# Patient Record
Sex: Male | Born: 1940 | Race: White | Hispanic: No | Marital: Married | Smoking: Never smoker
Health system: Southern US, Community
[De-identification: ages and names within clinical notes are randomized; demographics above are authoritative.]

## PROBLEM LIST (undated history)

## (undated) DIAGNOSIS — I10 Essential (primary) hypertension: Secondary | ICD-10-CM

## (undated) DIAGNOSIS — M48061 Spinal stenosis, lumbar region without neurogenic claudication: Secondary | ICD-10-CM

## (undated) DIAGNOSIS — M5030 Other cervical disc degeneration, unspecified cervical region: Secondary | ICD-10-CM

## (undated) DIAGNOSIS — K219 Gastro-esophageal reflux disease without esophagitis: Secondary | ICD-10-CM

## (undated) DIAGNOSIS — N189 Chronic kidney disease, unspecified: Secondary | ICD-10-CM

## (undated) DIAGNOSIS — M5136 Other intervertebral disc degeneration, lumbar region: Secondary | ICD-10-CM

## (undated) DIAGNOSIS — M1990 Unspecified osteoarthritis, unspecified site: Secondary | ICD-10-CM

## (undated) DIAGNOSIS — I209 Angina pectoris, unspecified: Secondary | ICD-10-CM

## (undated) DIAGNOSIS — E785 Hyperlipidemia, unspecified: Secondary | ICD-10-CM

## (undated) DIAGNOSIS — R001 Bradycardia, unspecified: Secondary | ICD-10-CM

## (undated) DIAGNOSIS — M51369 Other intervertebral disc degeneration, lumbar region without mention of lumbar back pain or lower extremity pain: Secondary | ICD-10-CM

## (undated) HISTORY — PX: TOE SURGERY: SHX1073

## (undated) HISTORY — PX: OTHER SURGICAL HISTORY: SHX169

## (undated) HISTORY — PX: BACK SURGERY: SHX140

## (undated) HISTORY — PX: APPENDECTOMY: SHX54

## (undated) HISTORY — PX: COLONOSCOPY: SHX174

---

## 2004-12-28 ENCOUNTER — Emergency Department: Payer: Self-pay | Admitting: Emergency Medicine

## 2004-12-29 ENCOUNTER — Ambulatory Visit: Payer: Self-pay | Admitting: Emergency Medicine

## 2007-11-25 ENCOUNTER — Ambulatory Visit: Payer: Self-pay | Admitting: Urology

## 2008-06-01 ENCOUNTER — Ambulatory Visit: Payer: Self-pay | Admitting: Family Medicine

## 2012-12-10 ENCOUNTER — Ambulatory Visit: Payer: Self-pay | Admitting: Family Medicine

## 2013-08-24 ENCOUNTER — Ambulatory Visit: Payer: Self-pay | Admitting: Family Medicine

## 2013-09-02 ENCOUNTER — Ambulatory Visit: Payer: Self-pay | Admitting: Specialist

## 2013-10-01 ENCOUNTER — Ambulatory Visit: Payer: Self-pay | Admitting: Cardiothoracic Surgery

## 2013-10-03 ENCOUNTER — Ambulatory Visit: Payer: Self-pay | Admitting: Cardiothoracic Surgery

## 2014-01-01 ENCOUNTER — Ambulatory Visit: Payer: Self-pay | Admitting: Specialist

## 2017-03-12 ENCOUNTER — Other Ambulatory Visit: Payer: Self-pay | Admitting: Neurology

## 2017-03-12 DIAGNOSIS — M48061 Spinal stenosis, lumbar region without neurogenic claudication: Secondary | ICD-10-CM

## 2017-03-20 ENCOUNTER — Ambulatory Visit
Admission: RE | Admit: 2017-03-20 | Discharge: 2017-03-20 | Disposition: A | Discharge status: Home / Self Care | Payer: Medicare HMO | Source: Ambulatory Visit | Attending: Neurology | Admitting: Neurology

## 2017-03-20 DIAGNOSIS — M48061 Spinal stenosis, lumbar region without neurogenic claudication: Secondary | ICD-10-CM | POA: Insufficient documentation

## 2017-03-21 ENCOUNTER — Ambulatory Visit: Payer: Medicare HMO

## 2017-04-09 ENCOUNTER — Encounter: Payer: Self-pay | Admitting: *Deleted

## 2017-04-10 ENCOUNTER — Encounter: Payer: Self-pay | Admitting: *Deleted

## 2017-04-10 ENCOUNTER — Ambulatory Visit: Payer: Medicare HMO | Admitting: Anesthesiology

## 2017-04-10 ENCOUNTER — Ambulatory Visit
Admission: RE | Admit: 2017-04-10 | Discharge: 2017-04-10 | Disposition: A | Discharge status: Home / Self Care | Payer: Medicare HMO | Source: Ambulatory Visit | Attending: Unknown Physician Specialty | Admitting: Unknown Physician Specialty

## 2017-04-10 ENCOUNTER — Encounter
Admission: RE | Disposition: A | Discharge status: Home / Self Care | Payer: Self-pay | Source: Ambulatory Visit | Attending: Unknown Physician Specialty

## 2017-04-10 DIAGNOSIS — R1310 Dysphagia, unspecified: Secondary | ICD-10-CM | POA: Diagnosis present

## 2017-04-10 DIAGNOSIS — M1990 Unspecified osteoarthritis, unspecified site: Secondary | ICD-10-CM | POA: Diagnosis not present

## 2017-04-10 DIAGNOSIS — I129 Hypertensive chronic kidney disease with stage 1 through stage 4 chronic kidney disease, or unspecified chronic kidney disease: Secondary | ICD-10-CM | POA: Insufficient documentation

## 2017-04-10 DIAGNOSIS — M5030 Other cervical disc degeneration, unspecified cervical region: Secondary | ICD-10-CM | POA: Diagnosis not present

## 2017-04-10 DIAGNOSIS — N189 Chronic kidney disease, unspecified: Secondary | ICD-10-CM | POA: Diagnosis not present

## 2017-04-10 DIAGNOSIS — M5136 Other intervertebral disc degeneration, lumbar region: Secondary | ICD-10-CM | POA: Diagnosis not present

## 2017-04-10 DIAGNOSIS — K222 Esophageal obstruction: Secondary | ICD-10-CM | POA: Diagnosis not present

## 2017-04-10 DIAGNOSIS — M48061 Spinal stenosis, lumbar region without neurogenic claudication: Secondary | ICD-10-CM | POA: Diagnosis not present

## 2017-04-10 DIAGNOSIS — Z7982 Long term (current) use of aspirin: Secondary | ICD-10-CM | POA: Insufficient documentation

## 2017-04-10 DIAGNOSIS — K219 Gastro-esophageal reflux disease without esophagitis: Secondary | ICD-10-CM | POA: Diagnosis not present

## 2017-04-10 DIAGNOSIS — Z79899 Other long term (current) drug therapy: Secondary | ICD-10-CM | POA: Insufficient documentation

## 2017-04-10 DIAGNOSIS — E785 Hyperlipidemia, unspecified: Secondary | ICD-10-CM | POA: Diagnosis not present

## 2017-04-10 HISTORY — DX: Gastro-esophageal reflux disease without esophagitis: K21.9

## 2017-04-10 HISTORY — PX: ESOPHAGOGASTRODUODENOSCOPY (EGD) WITH PROPOFOL: SHX5813

## 2017-04-10 HISTORY — DX: Angina pectoris, unspecified: I20.9

## 2017-04-10 HISTORY — DX: Other cervical disc degeneration, unspecified cervical region: M50.30

## 2017-04-10 HISTORY — DX: Hyperlipidemia, unspecified: E78.5

## 2017-04-10 HISTORY — DX: Other intervertebral disc degeneration, lumbar region: M51.36

## 2017-04-10 HISTORY — DX: Unspecified osteoarthritis, unspecified site: M19.90

## 2017-04-10 HISTORY — DX: Chronic kidney disease, unspecified: N18.9

## 2017-04-10 HISTORY — DX: Other intervertebral disc degeneration, lumbar region without mention of lumbar back pain or lower extremity pain: M51.369

## 2017-04-10 HISTORY — DX: Spinal stenosis, lumbar region without neurogenic claudication: M48.061

## 2017-04-10 HISTORY — DX: Bradycardia, unspecified: R00.1

## 2017-04-10 HISTORY — DX: Essential (primary) hypertension: I10

## 2017-04-10 SURGERY — ESOPHAGOGASTRODUODENOSCOPY (EGD) WITH PROPOFOL
Anesthesia: General

## 2017-04-10 MED ORDER — GLYCOPYRROLATE 0.2 MG/ML IJ SOLN
INTRAMUSCULAR | Status: AC
  Filled 2017-04-10: qty 1

## 2017-04-10 MED ORDER — SODIUM CHLORIDE 0.9 % IV SOLN
INTRAVENOUS | Status: DC
  Administered 2017-04-10: 13:00:00 via INTRAVENOUS

## 2017-04-10 MED ORDER — PROPOFOL 500 MG/50ML IV EMUL
INTRAVENOUS | Status: AC
  Filled 2017-04-10: qty 50

## 2017-04-10 MED ORDER — SODIUM CHLORIDE 0.9 % IV SOLN: INTRAVENOUS | Status: DC

## 2017-04-10 MED ORDER — PROPOFOL 500 MG/50ML IV EMUL
INTRAVENOUS | Status: DC | PRN
  Administered 2017-04-10: 120 ug/kg/min via INTRAVENOUS

## 2017-04-10 MED ORDER — LIDOCAINE HCL 2 % EX GEL
CUTANEOUS | Status: AC
  Filled 2017-04-10: qty 5

## 2017-04-10 MED ORDER — LIDOCAINE HCL (CARDIAC) 20 MG/ML IV SOLN
INTRAVENOUS | Status: DC | PRN
  Administered 2017-04-10: 30 mg via INTRAVENOUS

## 2017-04-10 MED ORDER — GLYCOPYRROLATE 0.2 MG/ML IJ SOLN
INTRAMUSCULAR | Status: DC | PRN
  Administered 2017-04-10: 0.1 mg via INTRAVENOUS

## 2017-04-10 MED ORDER — FENTANYL CITRATE (PF) 100 MCG/2ML IJ SOLN
INTRAMUSCULAR | Status: DC | PRN
  Administered 2017-04-10: 50 ug via INTRAVENOUS

## 2017-04-10 MED ORDER — FENTANYL CITRATE (PF) 100 MCG/2ML IJ SOLN
INTRAMUSCULAR | Status: AC
Start: 2017-04-10 — End: 2017-04-10
  Filled 2017-04-10: qty 2

## 2017-04-10 NOTE — Anesthesia Preprocedure Evaluation (Signed)
Anesthesia Evaluation  Patient identified by MRN, date of birth, ID band Patient awake    Reviewed: Allergy & Precautions, NPO status , Patient's Chart, lab work & pertinent test results  Airway Mallampati: II       Dental  (+) Upper Dentures, Lower Dentures   Pulmonary neg pulmonary ROS,    breath sounds clear to auscultation       Cardiovascular Exercise Tolerance: Good hypertension, Pt. on medications + angina  Rhythm:Regular Rate:Normal     Neuro/Psych negative neurological ROS  negative psych ROS   GI/Hepatic Neg liver ROS, GERD  ,  Endo/Other  negative endocrine ROS  Renal/GU CRFRenal disease     Musculoskeletal   Abdominal Normal abdominal exam  (+)   Peds  Hematology negative hematology ROS (+)   Anesthesia Other Findings   Reproductive/Obstetrics                             Anesthesia Physical Anesthesia Plan  ASA: II  Anesthesia Plan: General   Post-op Pain Management:    Induction: Intravenous  Airway Management Planned: Natural Airway and Nasal Cannula  Additional Equipment:   Intra-op Plan:   Post-operative Plan:   Informed Consent: I have reviewed the patients History and Physical, chart, labs and discussed the procedure including the risks, benefits and alternatives for the proposed anesthesia with the patient or authorized representative who has indicated his/her understanding and acceptance.     Plan Discussed with: CRNA  Anesthesia Plan Comments:         Anesthesia Quick Evaluation

## 2017-04-10 NOTE — Anesthesia Procedure Notes (Signed)
Performed by: COOK-MARTIN, Analena Gama Pre-anesthesia Checklist: Patient identified, Emergency Drugs available, Suction available, Patient being monitored and Timeout performed Patient Re-evaluated:Patient Re-evaluated prior to inductionOxygen Delivery Method: Nasal cannula Preoxygenation: Pre-oxygenation with 100% oxygen Intubation Type: IV induction Airway Equipment and Method: Bite block Placement Confirmation: CO2 detector and positive ETCO2     

## 2017-04-10 NOTE — Op Note (Signed)
[facility] Gastroenterology Patient Name: Cody HackJimmie Mullendore Procedure Date: 04/10/2017 2:00 PM MRN: [id number] Account #: [id number] Date of Birth: 12/11/1940 Admit Type: Outpatient Age: 89 [facility] Gender: Male Note Status: Finalized Procedure:            Upper GI endoscopy Indications:          Dysphagia Providers:            Scot Junobert T. Eneida Evers, MD Referring MD:         Nat ChristenMario E. Zada Finderslmedo, MD (Referring MD) Medicines:            Propofol per Anesthesia Complications:        No immediate complications. Procedure:            Pre-Anesthesia Assessment:                       - After reviewing the risks and benefits, the patient                        was deemed in satisfactory condition to undergo the                        procedure.                       After obtaining informed consent, the endoscope was                        passed under direct vision. Throughout the procedure,                        the patient's blood pressure, pulse, and oxygen                        saturations were monitored continuously. The Endoscope                        was introduced through the mouth, and advanced to the                        second part of duodenum. The upper GI endoscopy was                        accomplished without difficulty. The patient tolerated                        the procedure well. Findings:      A mild Schatzki ring (acquired) was found at the gastroesophageal       junction. A guidewire was placed and the scope was withdrawn. Dilation       was performed with a Savary dilator with mild resistance at 17 mm.      Patchy mildly erythematous mucosa without bleeding was found in the       gastric antrum. Stomach otherwise normal.      The examined duodenum was normal. Impression:           - Mild Schatzki ring. Dilated.                       - Erythematous mucosa in the antrum.                       -  Normal examined duodenum.      - No specimens collected. Recommendation:       - soft food for 3 days, eat slowly, chew well, take                        small bites. Stool or blood test for H. pylori stomach                        infection. Scot Jun, MD 04/10/2017 2:13:00 PM This report has been signed electronically. Number of Addenda: 0 Note Initiated On: 04/10/2017 2:00 PM      [facility]

## 2017-04-10 NOTE — H&P (Signed)
   Primary Care Physician:  Olmedo, Mario Ernesto, MD Primary GastroenterologiDione Housekeeperst:  Dr. Mechele CollinElliott  Pre-Procedure History & Physical: HPI:  Cody HangJimmie J Giovannini is a 76 y.o. male is here for an endoscopy.   Past Medical History:  Diagnosis Date  . Anginal pain (HCC)   . Arthritis   . Bradycardia   . CKD (chronic kidney disease)   . DDD (degenerative disc disease), cervical   . DDD (degenerative disc disease), lumbar   . Elevated lipids   . GERD (gastroesophageal reflux disease)   . Hypertension   . Spinal stenosis, lumbar     Past Surgical History:  Procedure Laterality Date  . APPENDECTOMY    . BACK SURGERY    . broncoscopy    . COLONOSCOPY    . TOE SURGERY      Prior to Admission medications   Medication Sig Start Date End Date Taking? Authorizing Provider  aspirin 81 MG chewable tablet Chew 81 mg by mouth daily.   Yes [provider]  diclofenac (VOLTAREN) 75 MG EC tablet Take 75 mg by mouth 2 (two) times daily.   Yes [provider]  diltiazem (CARDIZEM) 120 MG tablet Take 120 mg by mouth 4 (four) times daily.   Yes [provider]  lovastatin (MEVACOR) 40 MG tablet Take 40 mg by mouth at bedtime.   Yes [provider]  omeprazole (PRILOSEC) 20 MG capsule Take 20 mg by mouth daily.   Yes [provider]  predniSONE (DELTASONE) 10 MG tablet Take 10 mg by mouth daily with breakfast.   Yes [provider]    Allergies as of 01/07/2017  . (Not on File)    History reviewed. No pertinent family history.  Social History   Social History  . Marital status: Married    Spouse name: N/A  . Number of children: N/A  . Years of education: N/A   Occupational History  . Not on file.   Social History Main Topics  . Smoking status: Never Smoker  . Smokeless tobacco: Current User    Types: Chew  . Alcohol use No  . Drug use: No  . Sexual activity: Not on file   Other Topics Concern  . Not on file   Social History  Narrative  . No narrative on file    Review of Systems: See HPI, otherwise negative ROS  Physical Exam: BP 129/72   Pulse (!) 54   Temp (!) 96 F (35.6 C) (Tympanic)   Resp 18   Ht 6' (1.829 m)   Wt 78.5 kg (173 lb)   SpO2 100%   BMI 23.46 kg/m  General:   Alert,  pleasant and cooperative in NAD Head:  Normocephalic and atraumatic. Neck:  Supple; no masses or thyromegaly. Lungs:  Clear throughout to auscultation.    Heart:  Regular rate and rhythm. Abdomen:  Soft, nontender and nondistended. Normal bowel sounds, without guarding, and without rebound.   Neurologic:  Alert and  oriented x4;  grossly normal neurologically.  Impression/Plan: Cody Wong is here for an endoscopy to be performed for dysphagia.  Risks, benefits, limitations, and alternatives regarding  endoscopy have been reviewed with the patient.  Questions have been answered.  All parties agreeable.   Lynnae PrudeELLIOTT, ROBERT, MD  04/10/2017, 1:54 PM

## 2017-04-10 NOTE — Anesthesia Postprocedure Evaluation (Signed)
Anesthesia Post Note  Patient: Cody Wong  Procedure(s) Performed: Procedure(s) (LRB): ESOPHAGOGASTRODUODENOSCOPY (EGD) WITH PROPOFOL (N/A)  Patient location during evaluation: PACU Anesthesia Type: General Level of consciousness: awake Pain management: pain level controlled Vital Signs Assessment: post-procedure vital signs reviewed and stable Respiratory status: nonlabored ventilation Cardiovascular status: stable Anesthetic complications: no     Last Vitals:  Vitals:   04/10/17 1253 04/10/17 1415  BP: 129/72 99/63  Pulse: (!) 54 (!) 50  Resp: 18 16  Temp: (!) 35.6 C 36.1 C    Last Pain:  Vitals:   04/10/17 1415  TempSrc: Tympanic                 VAN STAVEREN,Kalifa Cadden

## 2017-04-10 NOTE — Transfer of Care (Signed)
Immediate Anesthesia Transfer of Care Note  Patient: Cody Wong  Procedure(s) Performed: Procedure(s): ESOPHAGOGASTRODUODENOSCOPY (EGD) WITH PROPOFOL (N/A)  Patient Location: PACU  Anesthesia Type:General  Level of Consciousness: awake and sedated  Airway & Oxygen Therapy: Patient Spontanous Breathing and Patient connected to nasal cannula oxygen  Post-op Assessment: Report given to RN and Post -op Vital signs reviewed and stable  Post vital signs: Reviewed and stable  Last Vitals:  Vitals:   04/10/17 1253  BP: 129/72  Pulse: (!) 54  Resp: 18  Temp: (!) 35.6 C    Last Pain:  Vitals:   04/10/17 1253  TempSrc: Tympanic         Complications: No apparent anesthesia complications

## 2017-04-10 NOTE — Anesthesia Post-op Follow-up Note (Cosign Needed)
Anesthesia QCDR form completed.        

## 2017-04-12 ENCOUNTER — Encounter: Payer: Self-pay | Admitting: Unknown Physician Specialty

## 2017-07-02 ENCOUNTER — Other Ambulatory Visit: Payer: Self-pay | Admitting: Orthopedic Surgery

## 2017-07-02 DIAGNOSIS — M1611 Unilateral primary osteoarthritis, right hip: Secondary | ICD-10-CM

## 2017-07-02 DIAGNOSIS — M25551 Pain in right hip: Principal | ICD-10-CM

## 2017-07-02 DIAGNOSIS — G8929 Other chronic pain: Secondary | ICD-10-CM

## 2017-07-09 ENCOUNTER — Ambulatory Visit
Admission: RE | Admit: 2017-07-09 | Discharge: 2017-07-09 | Disposition: A | Discharge status: Home / Self Care | Payer: Medicare HMO | Source: Ambulatory Visit | Attending: Orthopedic Surgery | Admitting: Orthopedic Surgery

## 2017-07-09 DIAGNOSIS — M1611 Unilateral primary osteoarthritis, right hip: Secondary | ICD-10-CM | POA: Diagnosis not present

## 2017-07-09 DIAGNOSIS — K4090 Unilateral inguinal hernia, without obstruction or gangrene, not specified as recurrent: Secondary | ICD-10-CM | POA: Insufficient documentation

## 2017-07-09 DIAGNOSIS — M25551 Pain in right hip: Secondary | ICD-10-CM | POA: Diagnosis present

## 2017-07-09 DIAGNOSIS — G8929 Other chronic pain: Secondary | ICD-10-CM | POA: Diagnosis not present

## 2017-09-12 ENCOUNTER — Other Ambulatory Visit: Payer: Self-pay | Admitting: Family Medicine

## 2017-09-12 ENCOUNTER — Ambulatory Visit
Admission: RE | Admit: 2017-09-12 | Discharge: 2017-09-12 | Disposition: A | Discharge status: Home / Self Care | Payer: Medicare HMO | Source: Ambulatory Visit | Attending: Family Medicine | Admitting: Family Medicine

## 2017-09-12 DIAGNOSIS — R1032 Left lower quadrant pain: Secondary | ICD-10-CM

## 2017-09-12 DIAGNOSIS — R1909 Other intra-abdominal and pelvic swelling, mass and lump: Secondary | ICD-10-CM | POA: Insufficient documentation

## 2017-10-09 ENCOUNTER — Other Ambulatory Visit: Payer: Self-pay | Admitting: Family Medicine

## 2017-10-09 DIAGNOSIS — R1909 Other intra-abdominal and pelvic swelling, mass and lump: Secondary | ICD-10-CM

## 2017-10-16 ENCOUNTER — Other Ambulatory Visit: Payer: Self-pay | Admitting: Family Medicine

## 2017-10-16 DIAGNOSIS — R1909 Other intra-abdominal and pelvic swelling, mass and lump: Secondary | ICD-10-CM

## 2018-08-28 ENCOUNTER — Other Ambulatory Visit: Payer: Self-pay | Admitting: Family Medicine

## 2018-08-28 DIAGNOSIS — R1031 Right lower quadrant pain: Secondary | ICD-10-CM

## 2018-09-02 ENCOUNTER — Other Ambulatory Visit: Payer: Self-pay | Admitting: Family Medicine

## 2018-09-02 DIAGNOSIS — R1012 Left upper quadrant pain: Secondary | ICD-10-CM

## 2018-09-02 DIAGNOSIS — R1011 Right upper quadrant pain: Secondary | ICD-10-CM

## 2018-09-03 ENCOUNTER — Ambulatory Visit
Admission: RE | Admit: 2018-09-03 | Discharge: 2018-09-03 | Disposition: A | Discharge status: Home / Self Care | Payer: Medicare HMO | Source: Ambulatory Visit | Attending: Family Medicine | Admitting: Family Medicine

## 2018-09-03 DIAGNOSIS — R1012 Left upper quadrant pain: Secondary | ICD-10-CM | POA: Insufficient documentation

## 2018-09-03 DIAGNOSIS — N2889 Other specified disorders of kidney and ureter: Secondary | ICD-10-CM | POA: Diagnosis not present

## 2018-09-03 DIAGNOSIS — R1011 Right upper quadrant pain: Secondary | ICD-10-CM | POA: Insufficient documentation

## 2018-09-11 ENCOUNTER — Other Ambulatory Visit: Payer: Self-pay | Admitting: Family Medicine

## 2018-09-11 DIAGNOSIS — N2889 Other specified disorders of kidney and ureter: Secondary | ICD-10-CM

## 2018-09-26 ENCOUNTER — Ambulatory Visit
Admission: RE | Admit: 2018-09-26 | Discharge: 2018-09-26 | Disposition: A | Discharge status: Home / Self Care | Payer: Medicare HMO | Source: Ambulatory Visit | Attending: Family Medicine | Admitting: Family Medicine

## 2018-09-26 DIAGNOSIS — N2889 Other specified disorders of kidney and ureter: Secondary | ICD-10-CM | POA: Insufficient documentation

## 2018-09-26 MED ORDER — GADOBUTROL 1 MMOL/ML IV SOLN
10.0000 mL | Freq: Once | INTRAVENOUS | Status: AC | PRN
  Administered 2018-09-26: 8 mL via INTRAVENOUS

## 2019-06-25 IMAGING — US US ABDOMEN COMPLETE
1 series · 13 of 25 positions shown · non-contrast
Comparison: Ultrasound abdomen 06/01/2008

CLINICAL DATA: Right upper quadrant and left upper quadrant pain

EXAM:
ABDOMEN ULTRASOUND COMPLETE

[Series 1: us abdomen complete · 0.27mm/px · 13 of 108 slices shown]
[im 1/108]
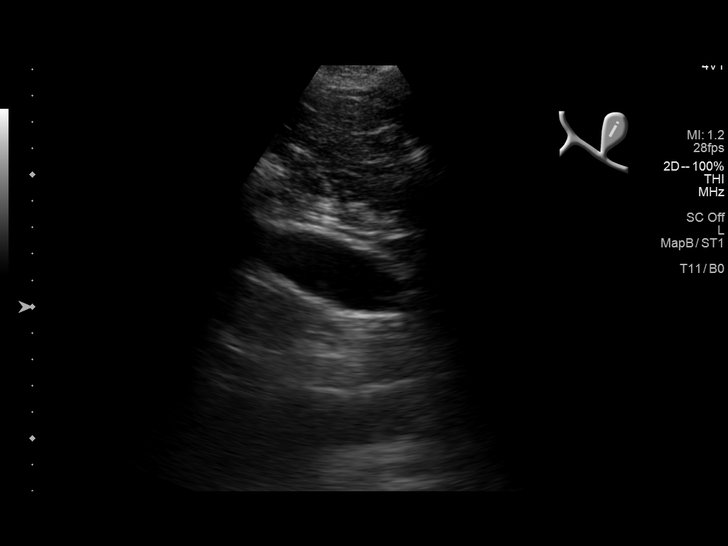
[im 9/108]
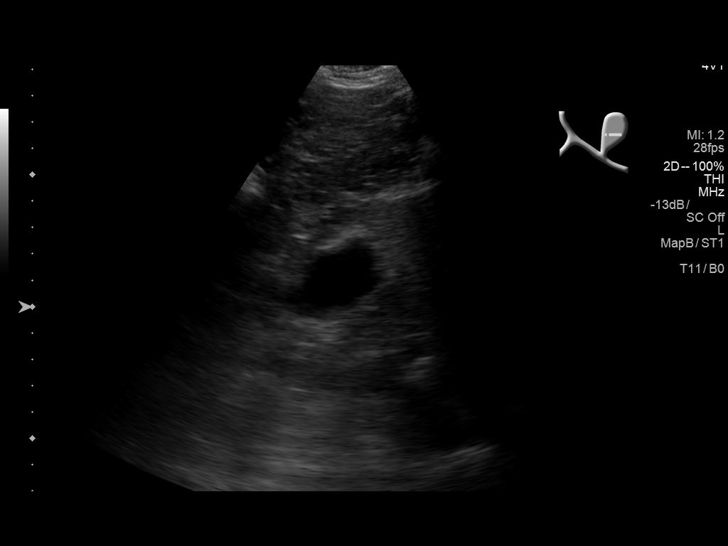
[im 18/108]
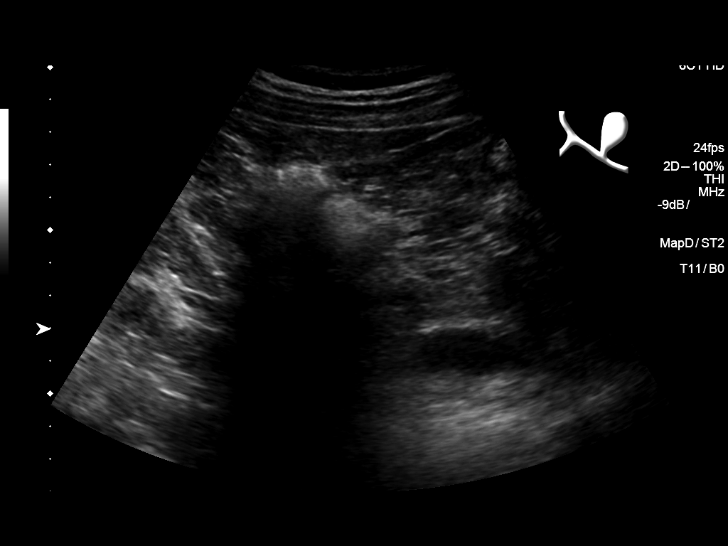
[im 27/108]
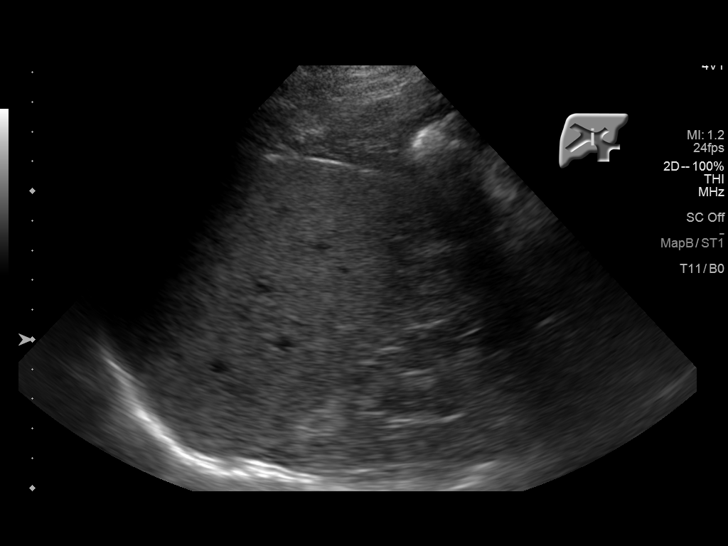
[im 36/108]
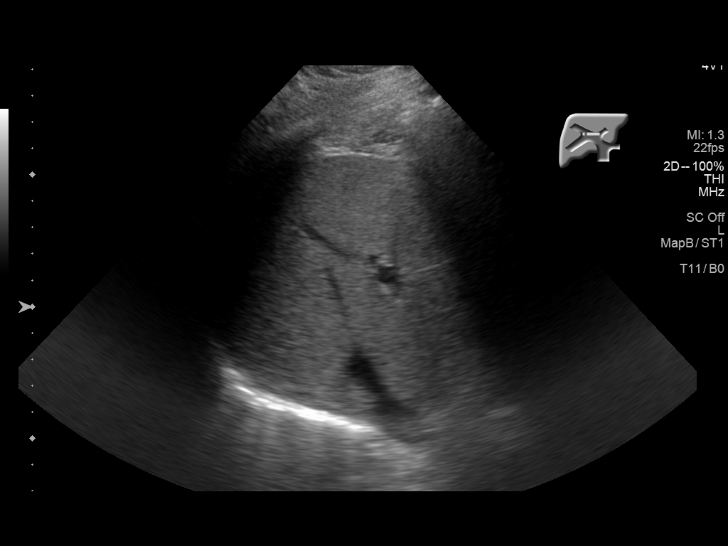
[im 45/108]
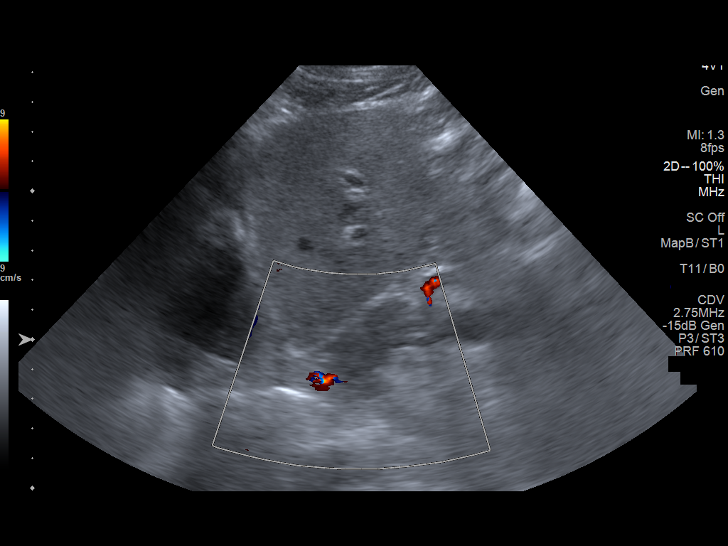
[im 54/108]
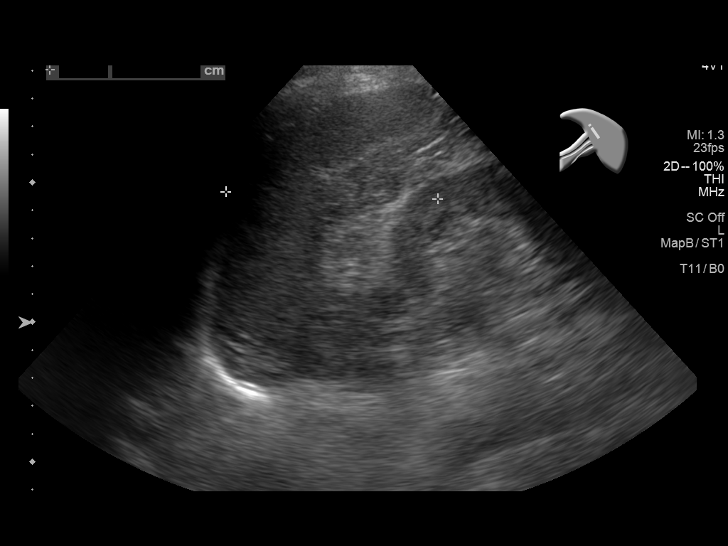
[im 63/108]
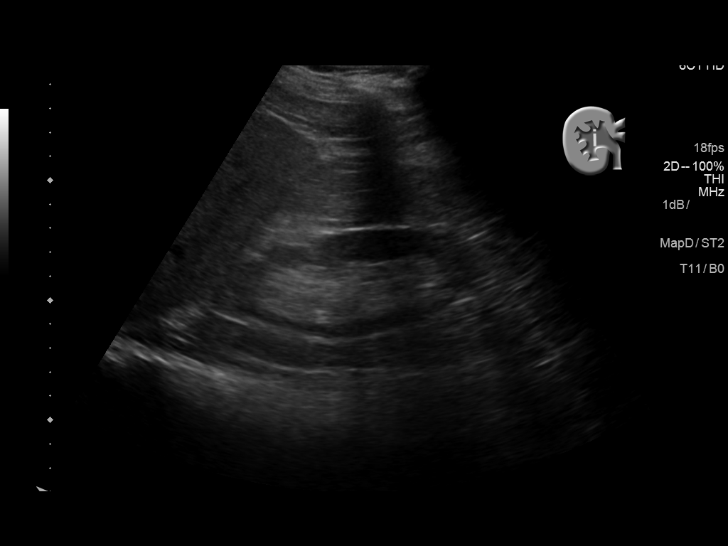
[im 72/108]
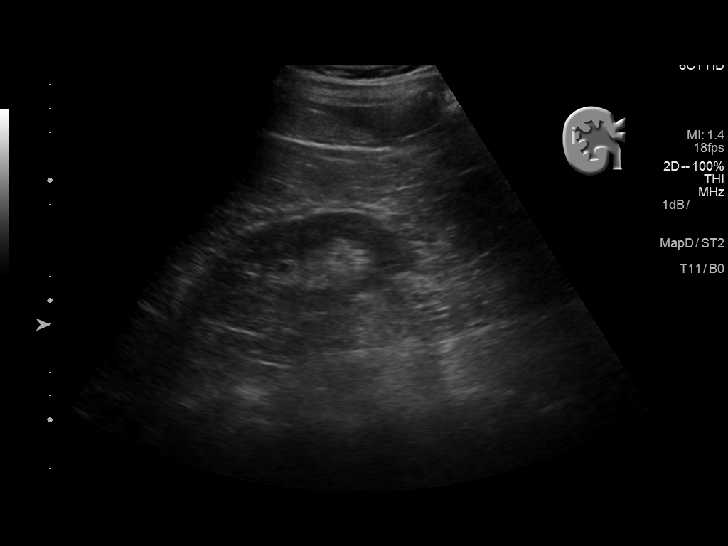
[im 81/108]
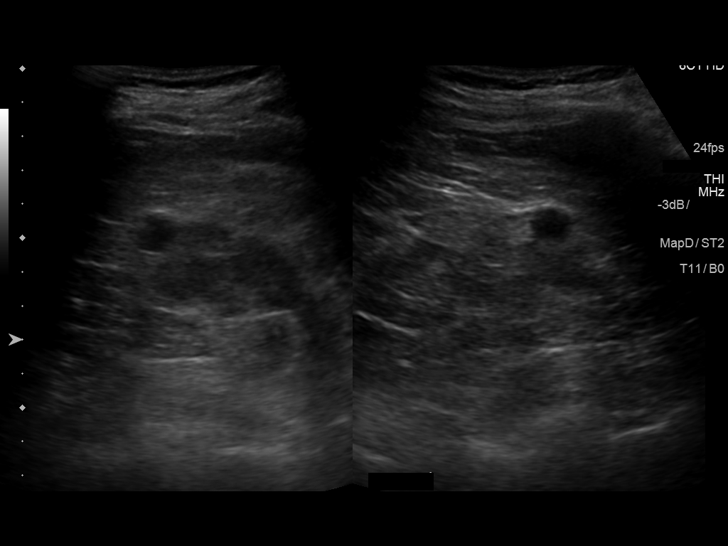
[im 90/108]
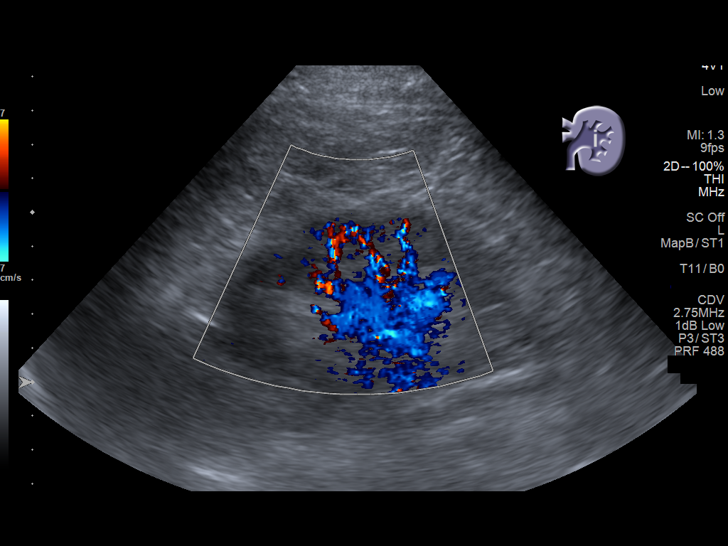
[im 99/108]
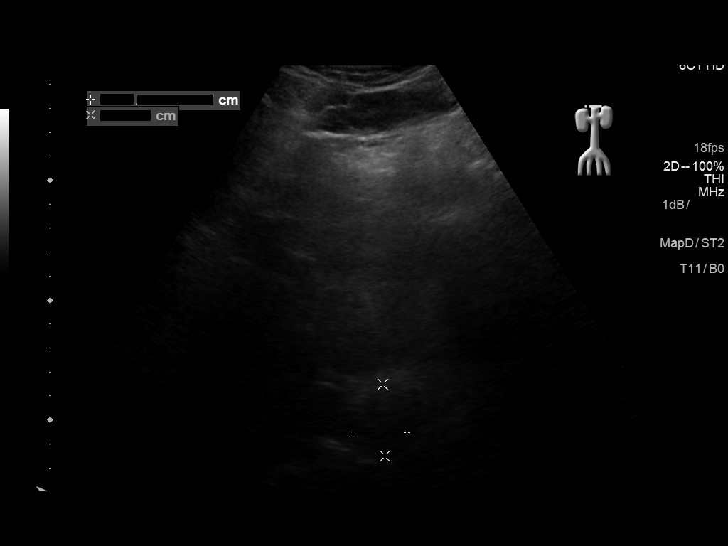
[im 108/108]
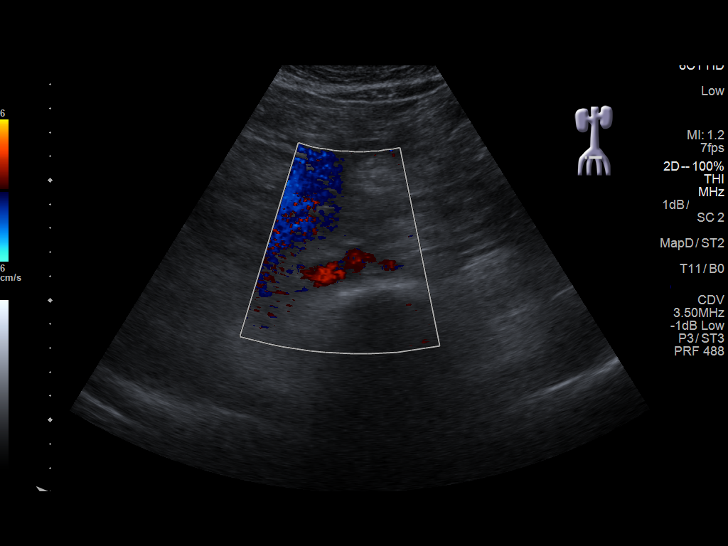

[13 of 25 positions shown; findings below may reference images not displayed]

FINDINGS: Gallbladder: No gallstones or wall thickening visualized. No
sonographic Murphy sign noted by sonographer.

Common bile duct: Diameter: 2.3 cm

Liver: No focal lesion identified. Within normal limits in
parenchymal echogenicity. Portal vein is patent on color Doppler
imaging with normal direction of blood flow towards the liver.

IVC: No abnormality visualized.

Pancreas: Visualized portion unremarkable.

Spleen: Size and appearance within normal limits.

Right Kidney: Length: 11.0 cm. Right lower pole solid renal mass
measures 2.0 x 2.1 x 2.4 cm. Review of prior CT from 11/25/2007
reveals a 14 mm slightly hyperdense nodule in this region. This
lesion appears to have grown over long period time suggesting a slow
growing neoplasm. Negative for hydronephrosis. Normal cortical
echogenicity.

Left Kidney: Length: 10.3 cm. Echogenicity within normal limits. No
mass or hydronephrosis visualized.

Abdominal aorta: No aneurysm visualized.

Other findings: None.
IMPRESSION: Negative for gallstones

Solid right lower pole renal mass 2.0 x 2.1 x 2.4 cm, showing slow
interval growth since 8330. Findings suspicious for renal cell
neoplasm. Further imaging is recommended. MRI kidneys without and
with contrast suggested. If the patient cannot have MRI, CT without
and with contrast suggested.
